# Patient Record
Sex: Male | Born: 1935 | Race: White | Hispanic: No | Smoking: Former smoker
Health system: Southern US, Community
[De-identification: ages and names within clinical notes are randomized; demographics above are authoritative.]

## PROBLEM LIST (undated history)

## (undated) DIAGNOSIS — N329 Bladder disorder, unspecified: Secondary | ICD-10-CM

## (undated) DIAGNOSIS — M810 Age-related osteoporosis without current pathological fracture: Secondary | ICD-10-CM

## (undated) DIAGNOSIS — E079 Disorder of thyroid, unspecified: Secondary | ICD-10-CM

## (undated) DIAGNOSIS — M7989 Other specified soft tissue disorders: Secondary | ICD-10-CM

## (undated) DIAGNOSIS — I209 Angina pectoris, unspecified: Secondary | ICD-10-CM

## (undated) DIAGNOSIS — R413 Other amnesia: Secondary | ICD-10-CM

## (undated) DIAGNOSIS — I509 Heart failure, unspecified: Secondary | ICD-10-CM

## (undated) DIAGNOSIS — H9190 Unspecified hearing loss, unspecified ear: Secondary | ICD-10-CM

## (undated) DIAGNOSIS — I252 Old myocardial infarction: Secondary | ICD-10-CM

## (undated) DIAGNOSIS — R0989 Other specified symptoms and signs involving the circulatory and respiratory systems: Secondary | ICD-10-CM

## (undated) DIAGNOSIS — J349 Unspecified disorder of nose and nasal sinuses: Secondary | ICD-10-CM

## (undated) DIAGNOSIS — D649 Anemia, unspecified: Secondary | ICD-10-CM

## (undated) DIAGNOSIS — L719 Rosacea, unspecified: Secondary | ICD-10-CM

## (undated) HISTORY — DX: Old myocardial infarction: I25.2

## (undated) HISTORY — PX: OTHER SURGICAL HISTORY: SHX169

## (undated) HISTORY — DX: Other specified symptoms and signs involving the circulatory and respiratory systems: R09.89

## (undated) HISTORY — DX: Age-related osteoporosis without current pathological fracture: M81.0

## (undated) HISTORY — DX: Unspecified hearing loss, unspecified ear: H91.90

## (undated) HISTORY — DX: Bladder disorder, unspecified: N32.9

## (undated) HISTORY — DX: Other specified soft tissue disorders: M79.89

## (undated) HISTORY — DX: Angina pectoris, unspecified: I20.9

## (undated) HISTORY — DX: Heart failure, unspecified: I50.9

## (undated) HISTORY — DX: Disorder of thyroid, unspecified: E07.9

## (undated) HISTORY — DX: Unspecified disorder of nose and nasal sinuses: J34.9

## (undated) HISTORY — DX: Rosacea, unspecified: L71.9

## (undated) HISTORY — PX: CATARACT EXTRACTION W/ INTRAOCULAR LENS  IMPLANT, BILATERAL: SHX1307

## (undated) HISTORY — DX: Other amnesia: R41.3

## (undated) HISTORY — DX: Anemia, unspecified: D64.9

---

## 2002-12-12 HISTORY — PX: OTHER SURGICAL HISTORY: SHX169

## 2003-10-19 ENCOUNTER — Other Ambulatory Visit: Payer: Self-pay

## 2006-02-28 ENCOUNTER — Ambulatory Visit: Payer: Self-pay | Admitting: Unknown Physician Specialty

## 2007-03-19 ENCOUNTER — Other Ambulatory Visit: Payer: Self-pay

## 2007-03-19 ENCOUNTER — Inpatient Hospital Stay: Payer: Self-pay | Admitting: Internal Medicine

## 2009-07-01 ENCOUNTER — Emergency Department: Payer: Self-pay | Admitting: Emergency Medicine

## 2010-02-02 ENCOUNTER — Ambulatory Visit: Payer: Self-pay | Admitting: Unknown Physician Specialty

## 2011-09-20 IMAGING — US ULTRASOUND RIGHT BREAST
1 series · 17 of 17 positions shown · non-contrast
Comparison: none

REASON FOR EXAM: R br nodule  4oclock
COMMENTS:

PROCEDURE:     US  - US BREAST RIGHT  - February 02, 2010  [DATE]
RESULT:     Bilateral gynecomastia is present. No clear-cut mass lesion is
noted.

[Series 1: ultrasound right breast · 17 of 17 slices shown]
[im 1/17]
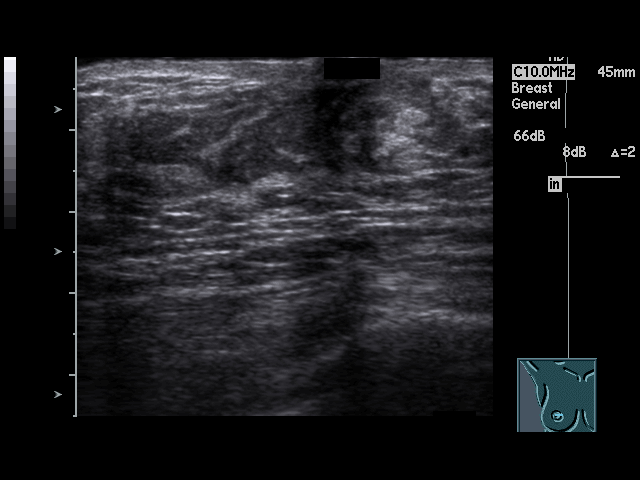
[im 2/17]
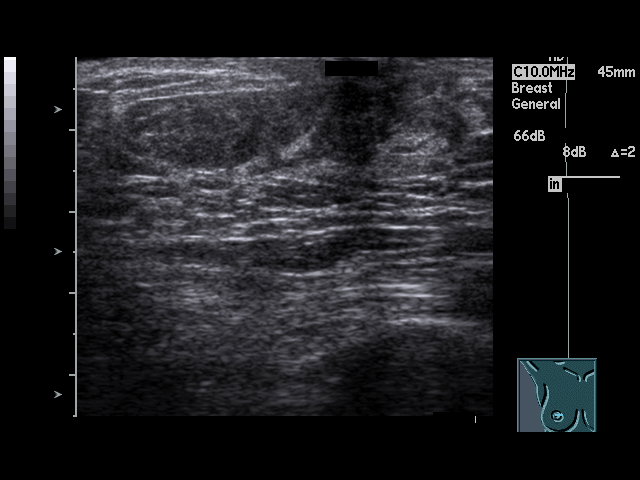
[im 3/17]
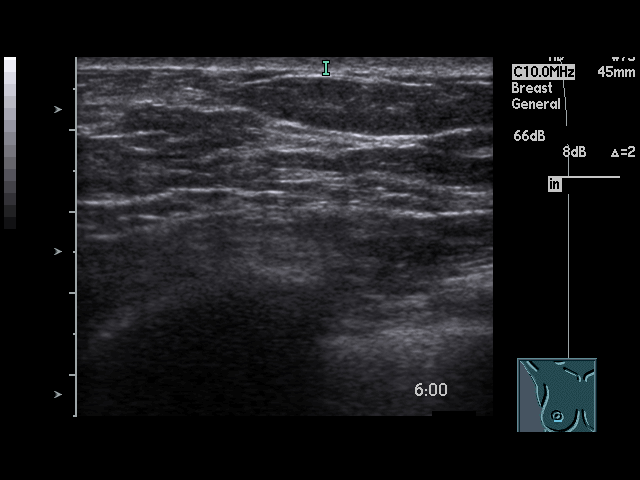
[im 4/17]
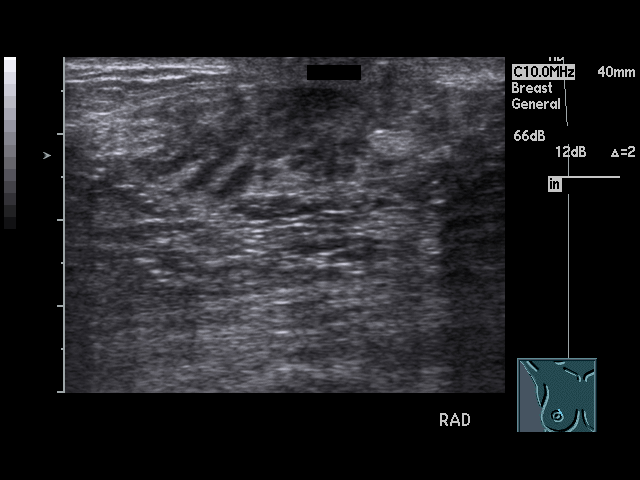
[im 5/17]
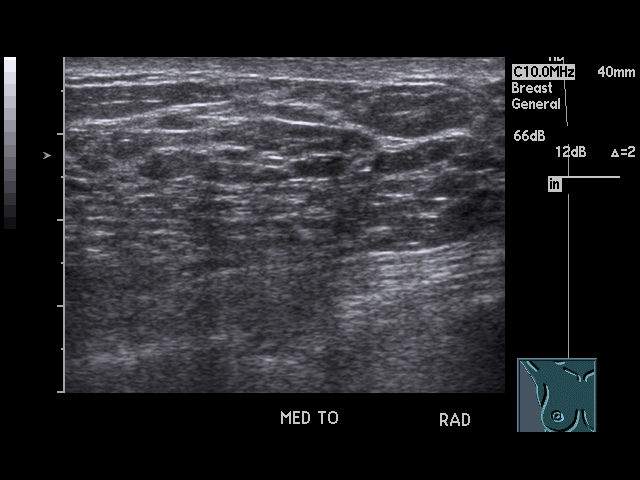
[im 6/17]
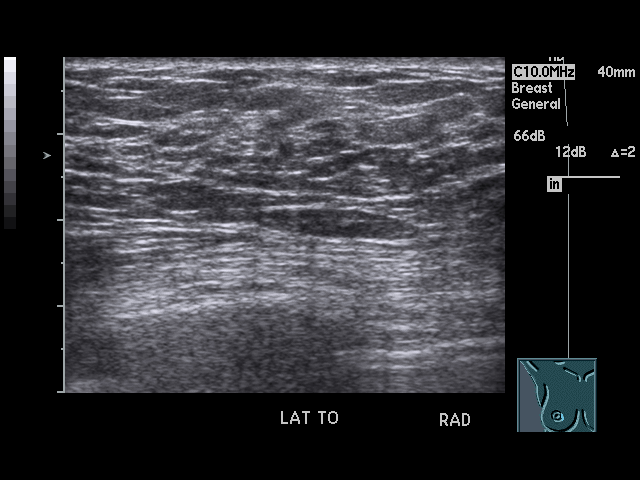
[im 7/17]
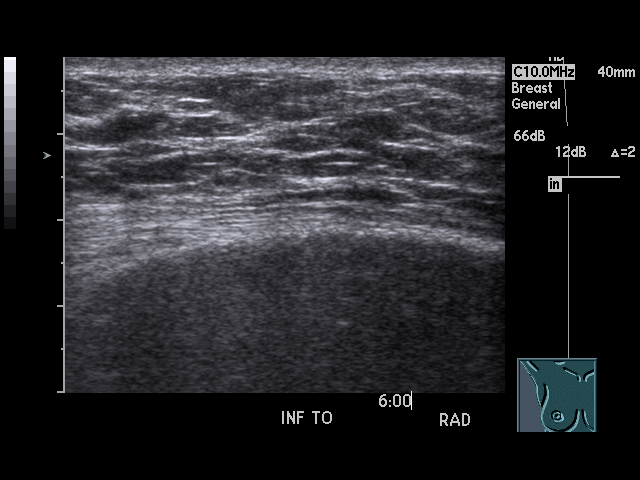
[im 8/17]
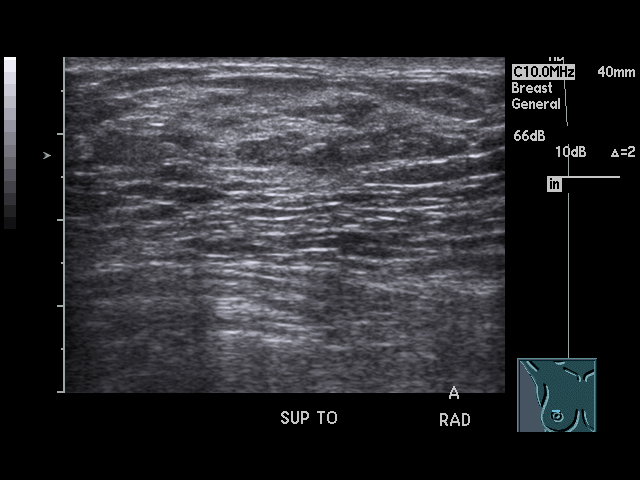
[im 9/17]
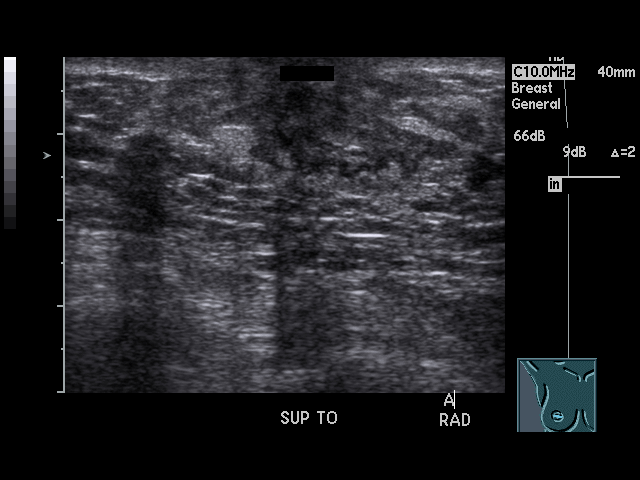
[im 10/17]
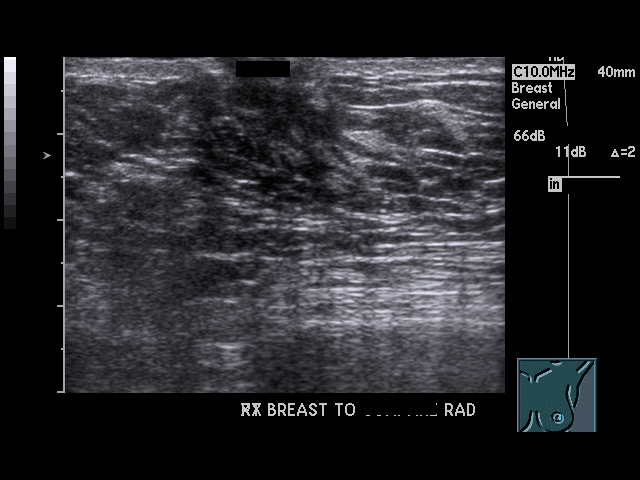
[im 11/17]
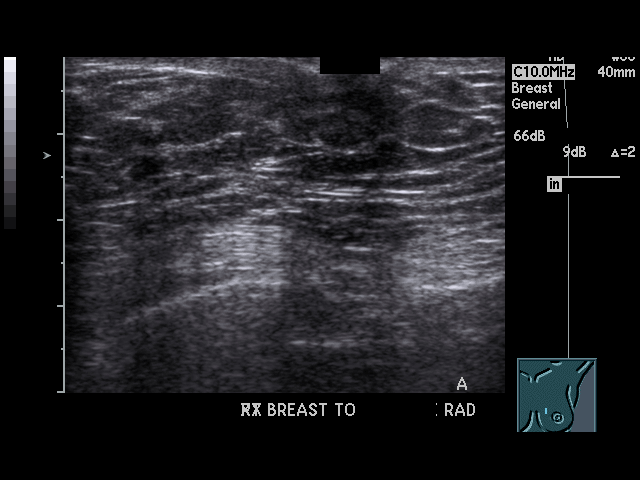
[im 12/17]
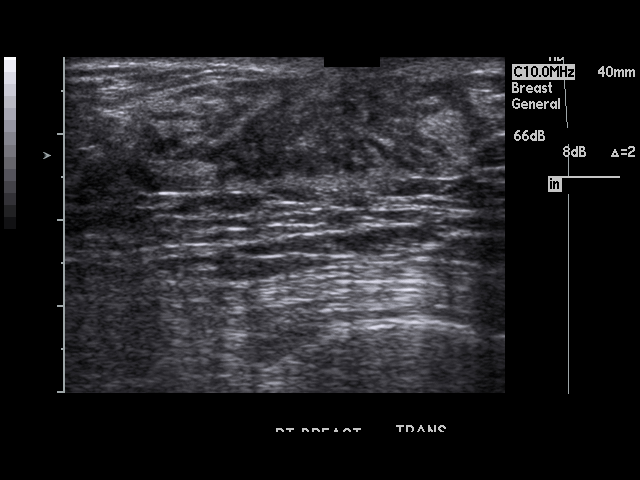
[im 13/17]
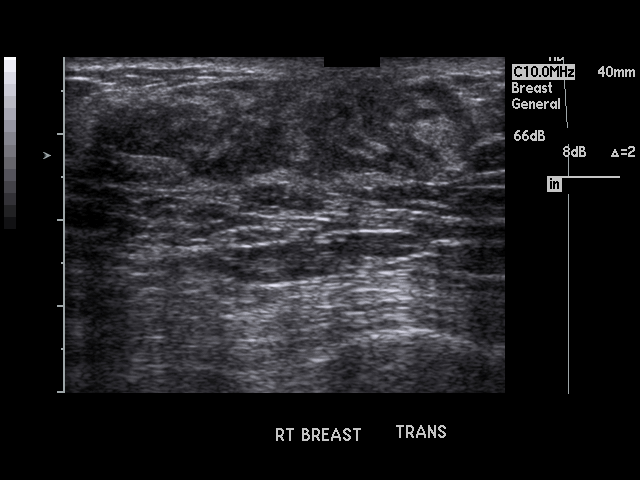
[im 14/17]
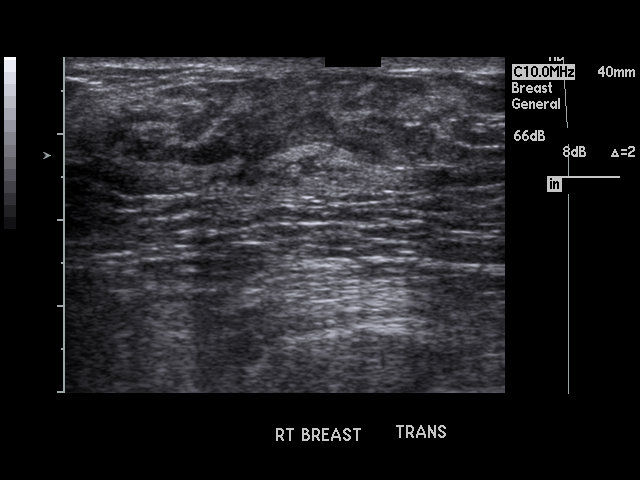
[im 15/17]
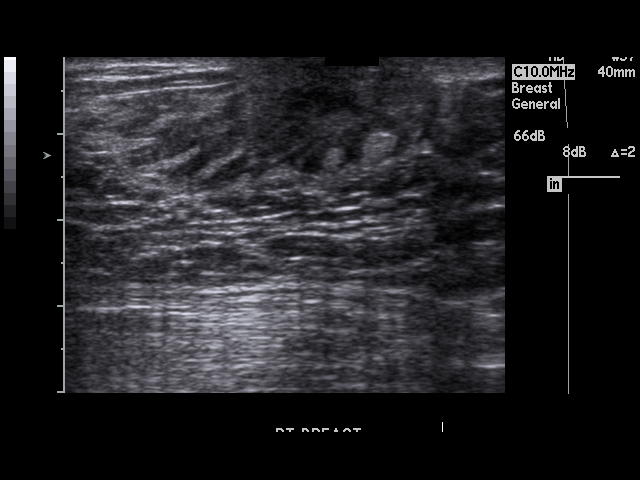
[im 16/17]
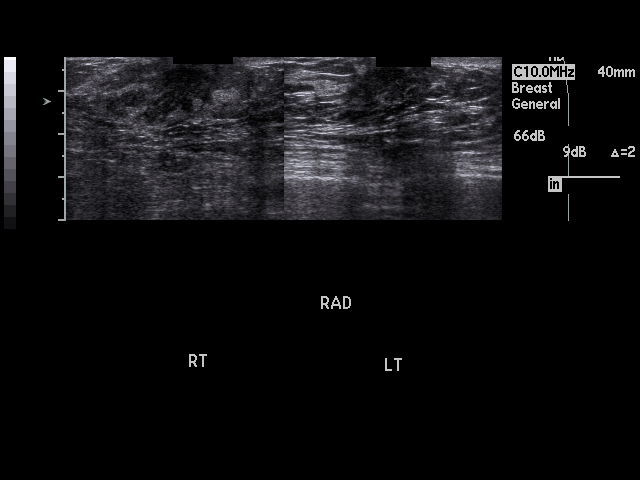
[im 17/17]
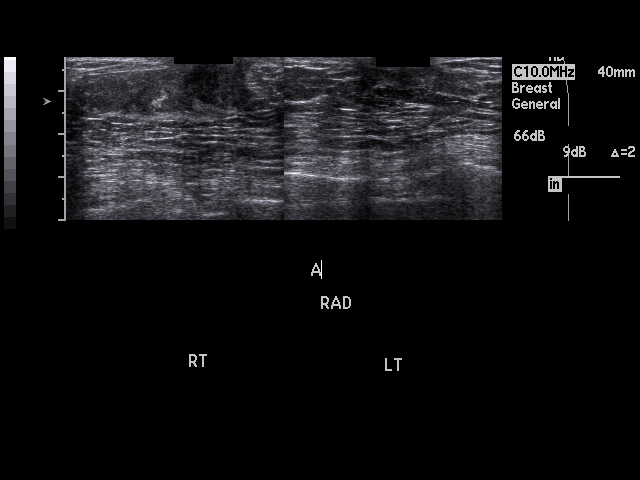

[17 of 17 positions shown; findings below may reference images not displayed]

IMPRESSION: 1.Gynecomastia.

## 2012-05-02 ENCOUNTER — Ambulatory Visit: Payer: Self-pay | Admitting: Unknown Physician Specialty

## 2012-05-02 LAB — CREATININE, SERUM: Creatinine: 0.88 mg/dL (ref 0.60–1.30)

## 2013-09-20 ENCOUNTER — Encounter: Payer: Self-pay | Admitting: Podiatry

## 2013-09-23 ENCOUNTER — Ambulatory Visit (INDEPENDENT_AMBULATORY_CARE_PROVIDER_SITE_OTHER): Payer: Medicare Other | Admitting: Podiatry

## 2013-09-23 ENCOUNTER — Encounter: Payer: Self-pay | Admitting: Podiatry

## 2013-09-23 VITALS — BP 141/89 | HR 85 | Resp 16 | Ht 70.0 in | Wt 170.0 lb

## 2013-09-23 DIAGNOSIS — IMO0002 Reserved for concepts with insufficient information to code with codable children: Secondary | ICD-10-CM | POA: Insufficient documentation

## 2013-09-23 DIAGNOSIS — L02619 Cutaneous abscess of unspecified foot: Secondary | ICD-10-CM

## 2013-09-23 NOTE — Progress Notes (Signed)
Devin Morgan presents today for followup of his abscess hallux right x1 week. Last time he was here we performed an incision and drainage to the hallux right we started him on oral antibiotics and is tolerated this well. He continues to soak on a daily basis in Epsom salts and water. States that I think it's about 75% better.  Objective: Pulses strongly palpable to the right lower extremity. There is no longer any erythema saline is drainage or odor to the tibial border of the hallux right. It appears to be healing quite nicely. It is mildly tender on palpation to the tip of the toe. No purulence or malodor.  Assessment: Well-healing abscess cellulitis right.  Plan: Continue to soak in Epsom salts and water on a daily basis until there is no redness no drainage and no pain on palpation. He will continue his antibiotic having it refilled as well continued cover during the day and Levaquin night. We'll put him on an as-needed basis.

## 2013-09-23 NOTE — Patient Instructions (Signed)
Continue Antibiotics and refill.  Continue to soak in epsom salts and warm water twice daily. Cover with a band-aid during the day only and leave open at night.

## 2013-12-18 IMAGING — CT CT ABD-PELV W/ CM
1 of 2 series · 15 of 32 positions shown, 19 images · non-contrast
Comparison: none

REASON FOR EXAM: Diffuse abdominal pain RLQ abd pain x 2 to 3 days  Eval
for appendicitis
COMMENTS:

PROCEDURE:     CT  - CT ABDOMEN / PELVIS  W  - May 02, 2012 [DATE]
RESULT:
TECHNIQUE: Helical 3 mm sections were obtained from the lung bases through
the pubic symphysis status post intravenous administration of 85 mL of
Ksovue-YJJ and oral contrast.

[Series 2: 3mm soft tissue · axial · 0.98mm/px · z∈[-468,-30]mm · 15 of 160 slices shown, 19 images]
[im 7/160  soft-tissue]
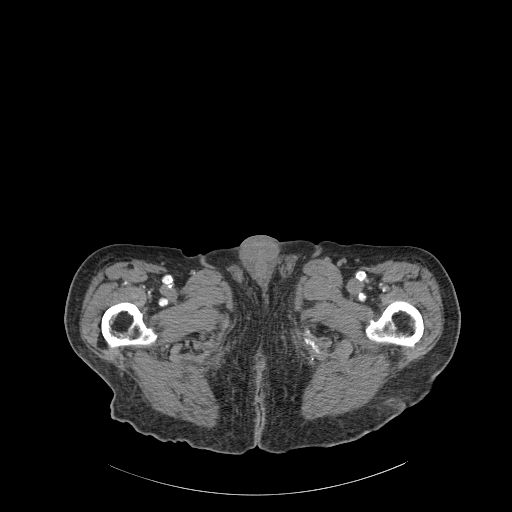
[im 7/160  bone]
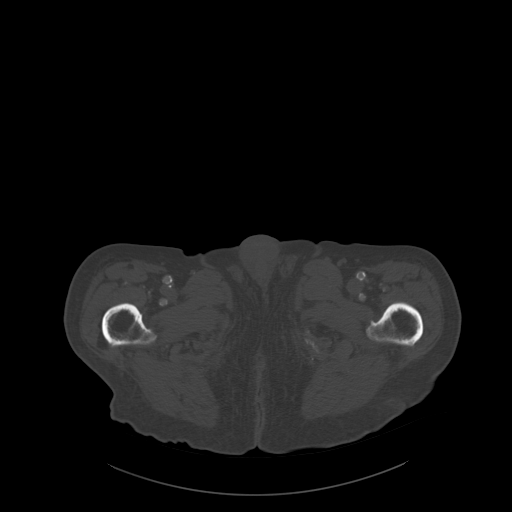
[im 21/160  soft-tissue]
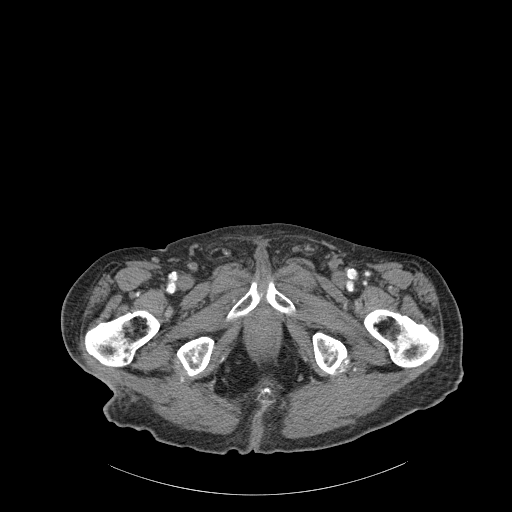
[im 35/160  soft-tissue]
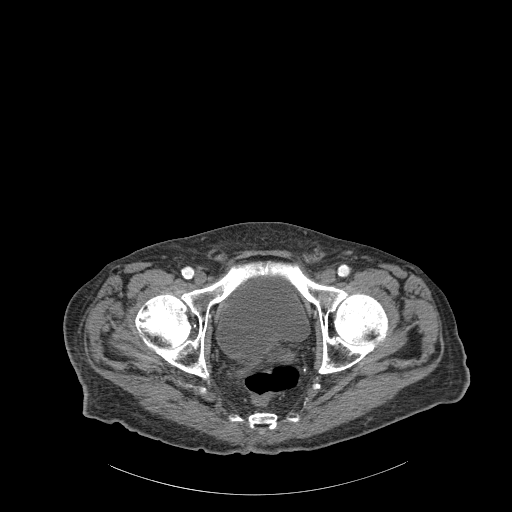
[im 42/160  soft-tissue]
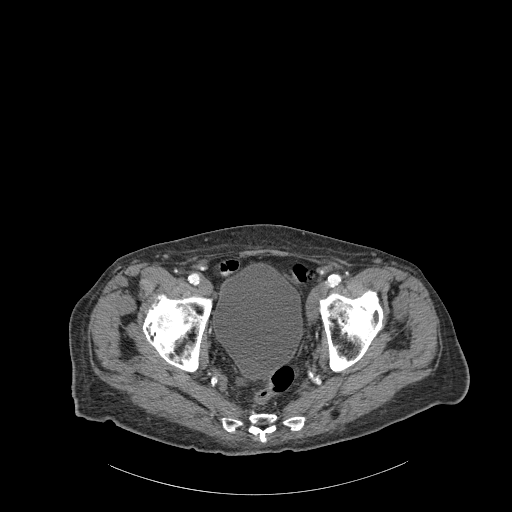
[im 56/160  soft-tissue]
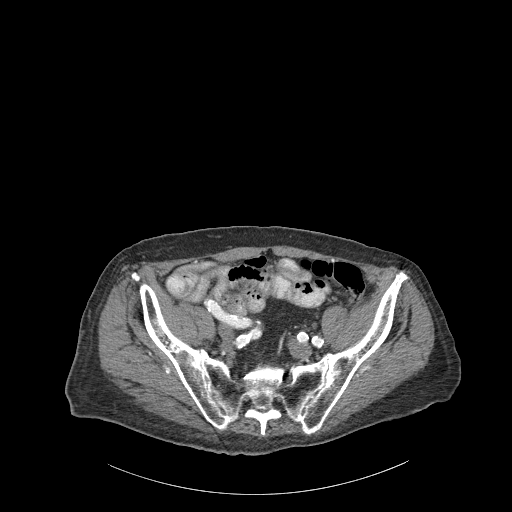
[im 70/160  soft-tissue]
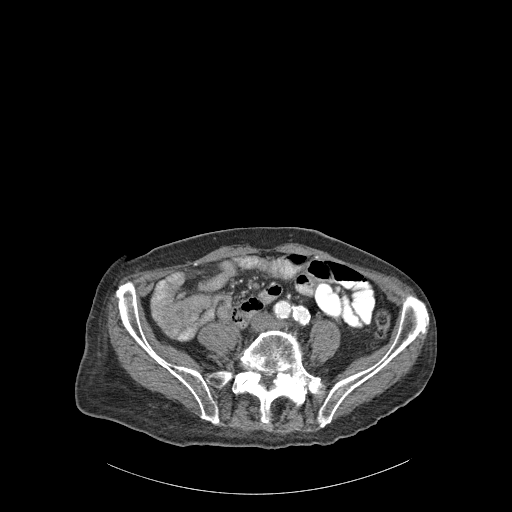
[im 83/160  soft-tissue]
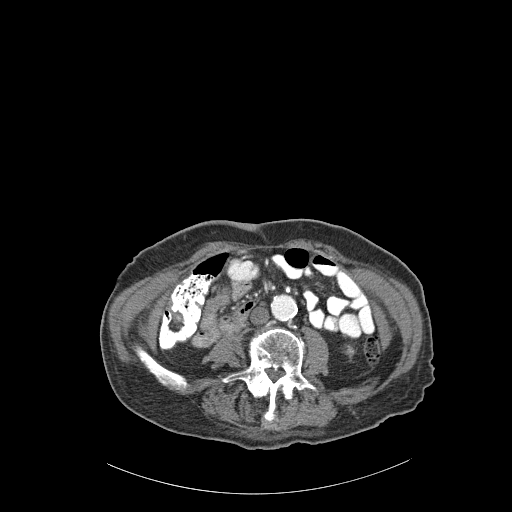
[im 90/160  soft-tissue]
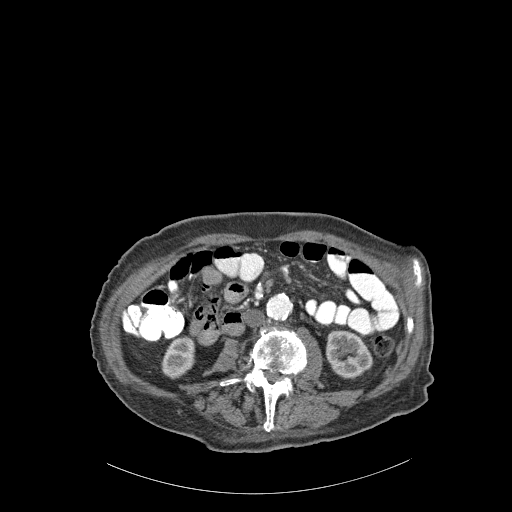
[im 104/160  soft-tissue]
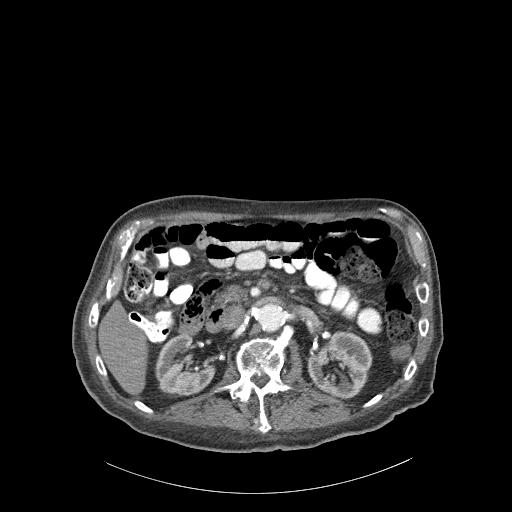
[im 104/160  bone]
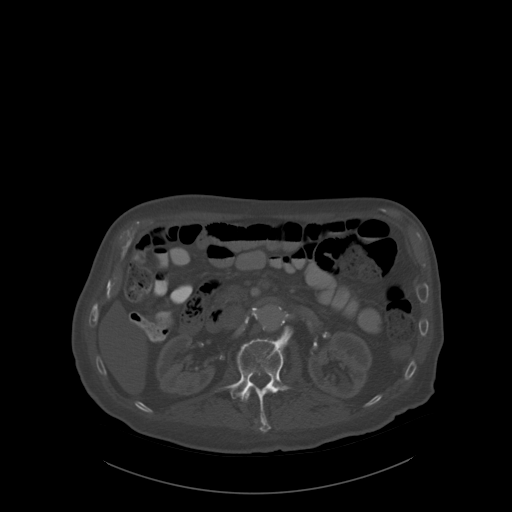
[im 118/160  soft-tissue]
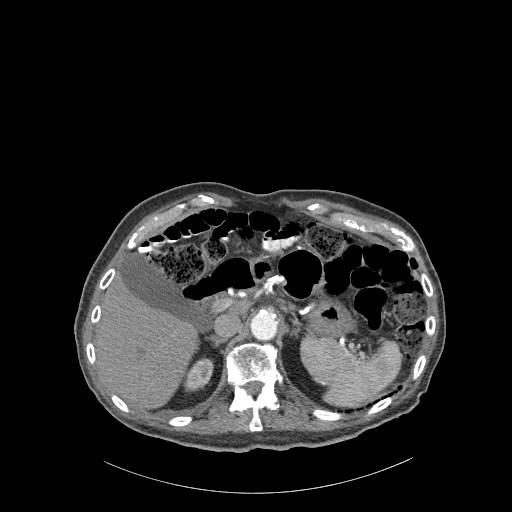
[im 125/160  soft-tissue]
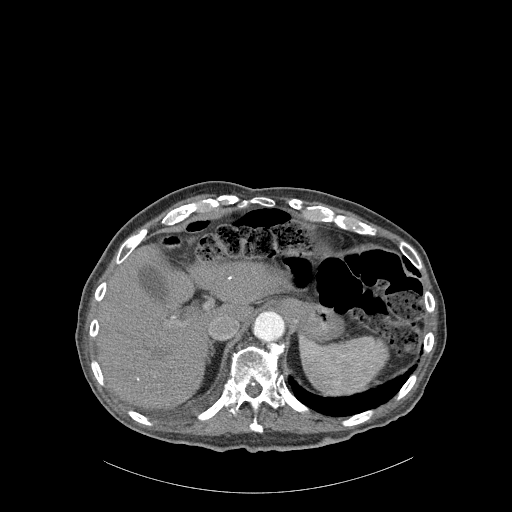
[im 132/160  lung]
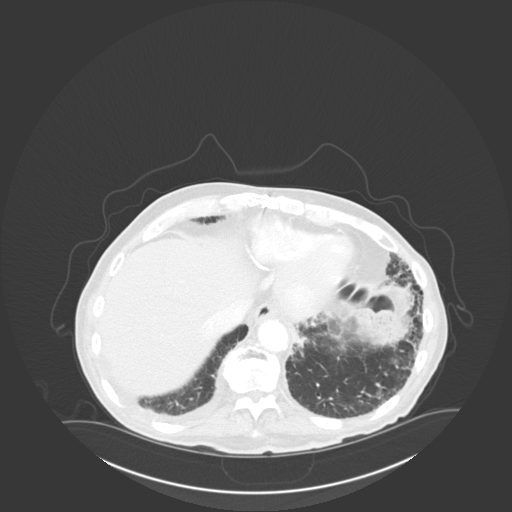
[im 139/160  soft-tissue]
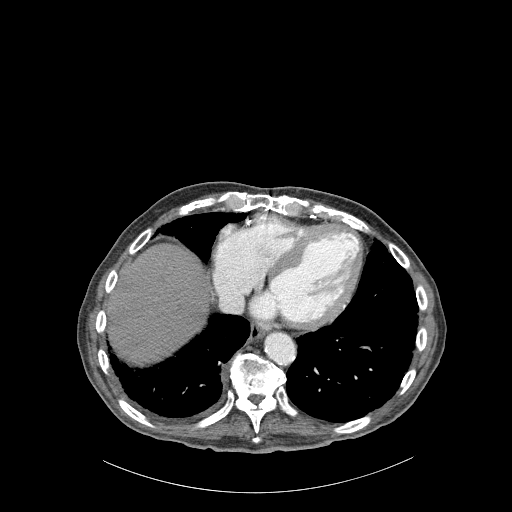
[im 139/160  lung]
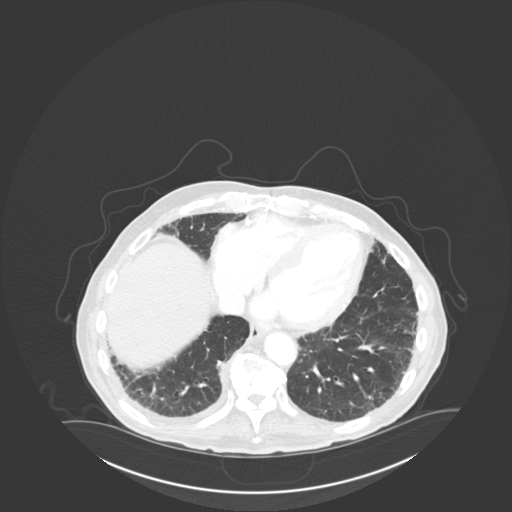
[im 146/160  lung]
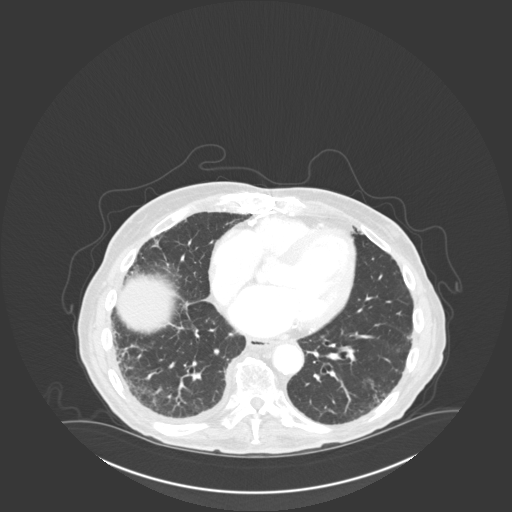
[im 153/160  soft-tissue]
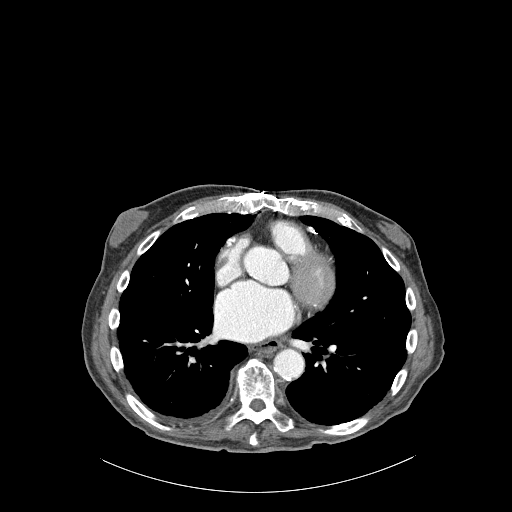
[im 153/160  lung]
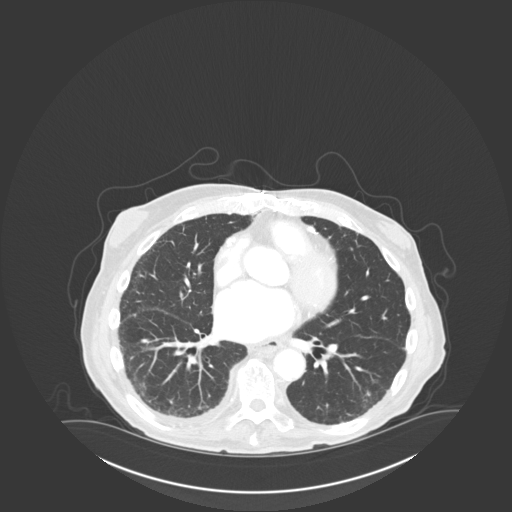

[15 of 32 positions shown; findings below may reference images not displayed]

Evaluation of the lung bases demonstrates prominence and thickening of the
interstitial markings. Peripheral areas of increased density project along
the base of the right and left lower lobes as well as a component of
subpleural septal thickening and mild honeycombing. No focal regions of
consolidation are identified.

The liver, spleen, right adrenal, pancreas, and kidneys are unremarkable.

Evaluation of the left adrenal demonstrates mild nodular enlargement,
indeterminate. There is no evidence of an abdominal aortic aneurysm.

Evaluation of the right paracolic gutter region demonstrates no CT evidence
of appendicitis at this time. The appendix is identified and appears to be
air and contrast filled. There is no evidence of free fluid or loculated
fluid collections. There is no evidence of abdominal or pelvic free fluid,
loculated fluid collections, masses or adenopathy or evidence of abdominal
aortic aneurysm. Note; if there is persistent concern of appendicitis a
repeat evaluation in 24 to 48 hours can be obtained if clinically warranted.
The celiac, SMA, IMA, portal vein and SMV are opacified. Coarse vascular
calcifications are identified within the abdominal aorta and associated
vasculature.
IMPRESSION: 1. No CT evidence of obstructive or inflammatory abnormalities. There is no
CT evidence reflecting sequela of appendicitis at this time.
2. Findings likely representing a component of fibrotic changes within the
right and left lung bases.

## 2014-04-18 DIAGNOSIS — I5022 Chronic systolic (congestive) heart failure: Secondary | ICD-10-CM | POA: Insufficient documentation

## 2014-04-18 DIAGNOSIS — I2510 Atherosclerotic heart disease of native coronary artery without angina pectoris: Secondary | ICD-10-CM | POA: Insufficient documentation

## 2014-04-18 DIAGNOSIS — E785 Hyperlipidemia, unspecified: Secondary | ICD-10-CM | POA: Insufficient documentation

## 2014-04-18 DIAGNOSIS — E039 Hypothyroidism, unspecified: Secondary | ICD-10-CM | POA: Insufficient documentation

## 2014-05-29 ENCOUNTER — Telehealth: Payer: Self-pay | Admitting: *Deleted

## 2014-05-29 NOTE — Telephone Encounter (Signed)
Saw him in September of last year.  He has a big problem with his big toe again.

## 2014-05-30 ENCOUNTER — Telehealth: Payer: Self-pay | Admitting: *Deleted

## 2014-05-30 NOTE — Telephone Encounter (Signed)
Tried calling pt back several times. Unable to leave a voicemail at these times.

## 2014-06-05 ENCOUNTER — Encounter: Payer: Self-pay | Admitting: *Deleted

## 2014-06-05 ENCOUNTER — Encounter: Payer: Self-pay | Admitting: Podiatry

## 2014-06-05 ENCOUNTER — Ambulatory Visit (INDEPENDENT_AMBULATORY_CARE_PROVIDER_SITE_OTHER): Payer: Medicare Other | Admitting: Podiatry

## 2014-06-05 VITALS — Ht 69.5 in | Wt 160.0 lb

## 2014-06-05 DIAGNOSIS — M79676 Pain in unspecified toe(s): Secondary | ICD-10-CM

## 2014-06-05 DIAGNOSIS — B351 Tinea unguium: Secondary | ICD-10-CM

## 2014-06-05 DIAGNOSIS — L03039 Cellulitis of unspecified toe: Principal | ICD-10-CM

## 2014-06-05 DIAGNOSIS — M79609 Pain in unspecified limb: Secondary | ICD-10-CM

## 2014-06-05 DIAGNOSIS — L02619 Cutaneous abscess of unspecified foot: Secondary | ICD-10-CM

## 2014-06-05 NOTE — Progress Notes (Signed)
   Subjective:    Patient ID: Devin Morgan, male    DOB: 1914/01/04, 80 y.o.   MRN: [id number]  HPI Comments: About a week ago he fell and somehow the nail got pulled off on the left great toenail. It seems to be a little infected. Been using some stuff that he got for a ingrown toenail, also was using neosporin      Review of Systems     Objective:   Physical Exam: I have reviewed his past medical history medications allergies surgeries social history and review of systems. Pulses are barely palpable bilateral capillary fill time is somewhat sluggish but his feet are warm to the touch. Neurologic sensorium is intact today. Deep tendon reflexes are in non-elicitable. Muscle strength is 4/5 at best dorsiflexors plantar flexors inverters and evertors. Cutaneous evaluation demonstrates supple well hydrated cutis nail trauma to the hallux left otherwise the remaining nails are thick yellow dystrophic and clinically mycotic. I see no signs of infection as far as bacterial infections to the hallux left.        Assessment & Plan:  Assessment: Pain in limb secondary to onychomycosis 1 through 5 bilateral.  Plan: Debridement of nails 1 through 5 bilateral.

## 2015-02-10 DEATH — deceased
